# Patient Record
Sex: Female | Born: 1955 | Race: White | Hispanic: No | Marital: Married | Smoking: Current every day smoker
Health system: Southern US, Community
[De-identification: ages and names within clinical notes are randomized; demographics above are authoritative.]

## PROBLEM LIST (undated history)

## (undated) DIAGNOSIS — I10 Essential (primary) hypertension: Secondary | ICD-10-CM

## (undated) DIAGNOSIS — I219 Acute myocardial infarction, unspecified: Secondary | ICD-10-CM

## (undated) DIAGNOSIS — I509 Heart failure, unspecified: Secondary | ICD-10-CM

## (undated) DIAGNOSIS — K5790 Diverticulosis of intestine, part unspecified, without perforation or abscess without bleeding: Secondary | ICD-10-CM

## (undated) DIAGNOSIS — E7800 Pure hypercholesterolemia, unspecified: Secondary | ICD-10-CM

## (undated) HISTORY — PX: CHOLECYSTECTOMY: SHX55

## (undated) HISTORY — PX: OOPHORECTOMY: SHX6387

---

## 2009-09-28 ENCOUNTER — Emergency Department (HOSPITAL_BASED_OUTPATIENT_CLINIC_OR_DEPARTMENT_OTHER): Admission: EM | Admit: 2009-09-28 | Discharge: 2009-09-28 | Payer: Self-pay | Admitting: Emergency Medicine

## 2009-09-28 ENCOUNTER — Ambulatory Visit: Payer: Self-pay | Admitting: Diagnostic Radiology

## 2010-08-05 LAB — DIFFERENTIAL
Basophils Absolute: 0.2 10*3/uL — ABNORMAL HIGH (ref 0.0–0.1)
Basophils Relative: 2 % — ABNORMAL HIGH (ref 0–1)
Eosinophils Absolute: 0.1 10*3/uL (ref 0.0–0.7)
Eosinophils Relative: 1 % (ref 0–5)
Lymphocytes Relative: 15 % (ref 12–46)
Monocytes Absolute: 0.7 10*3/uL (ref 0.1–1.0)
Monocytes Relative: 6 % (ref 3–12)

## 2010-08-05 LAB — BASIC METABOLIC PANEL
BUN: 10 mg/dL (ref 6–23)
CO2: 27 mEq/L (ref 19–32)
Calcium: 9 mg/dL (ref 8.4–10.5)
Chloride: 108 mEq/L (ref 96–112)
Creatinine, Ser: 0.8 mg/dL (ref 0.4–1.2)
GFR calc non Af Amer: >60 mL/min (ref >60)
Glucose, Bld: 108 mg/dL — ABNORMAL HIGH (ref 70–99)

## 2010-08-05 LAB — URINALYSIS, ROUTINE W REFLEX MICROSCOPIC
Hgb urine dipstick: NEGATIVE
Protein, ur: NEGATIVE mg/dL
Specific Gravity, Urine: 1.013 (ref 1.005–1.030)
Urobilinogen, UA: 0.2 mg/dL (ref 0.0–1.0)

## 2010-08-05 LAB — HEMOCCULT GUIAC POC 1CARD (OFFICE): Fecal Occult Bld: NEGATIVE

## 2010-08-05 LAB — CBC
Hemoglobin: 13.3 g/dL (ref 12.0–15.0)
WBC: 12.5 10*3/uL — ABNORMAL HIGH (ref 4.0–10.5)

## 2016-08-14 ENCOUNTER — Emergency Department (HOSPITAL_COMMUNITY): Payer: Self-pay

## 2016-08-14 ENCOUNTER — Emergency Department (HOSPITAL_COMMUNITY)
Admission: EM | Admit: 2016-08-14 | Discharge: 2016-08-14 | Disposition: A | Discharge status: Home / Self Care | Payer: Self-pay | Attending: Emergency Medicine | Admitting: Emergency Medicine

## 2016-08-14 ENCOUNTER — Encounter: Payer: Self-pay | Admitting: Emergency Medicine

## 2016-08-14 DIAGNOSIS — K5792 Diverticulitis of intestine, part unspecified, without perforation or abscess without bleeding: Secondary | ICD-10-CM | POA: Insufficient documentation

## 2016-08-14 DIAGNOSIS — I252 Old myocardial infarction: Secondary | ICD-10-CM | POA: Insufficient documentation

## 2016-08-14 DIAGNOSIS — F17210 Nicotine dependence, cigarettes, uncomplicated: Secondary | ICD-10-CM | POA: Insufficient documentation

## 2016-08-14 DIAGNOSIS — Z7982 Long term (current) use of aspirin: Secondary | ICD-10-CM | POA: Insufficient documentation

## 2016-08-14 DIAGNOSIS — I10 Essential (primary) hypertension: Secondary | ICD-10-CM | POA: Insufficient documentation

## 2016-08-14 HISTORY — DX: Pure hypercholesterolemia, unspecified: E78.00

## 2016-08-14 HISTORY — DX: Heart failure, unspecified: I50.9

## 2016-08-14 HISTORY — DX: Acute myocardial infarction, unspecified: I21.9

## 2016-08-14 HISTORY — DX: Essential (primary) hypertension: I10

## 2016-08-14 HISTORY — DX: Diverticulosis of intestine, part unspecified, without perforation or abscess without bleeding: K57.90

## 2016-08-14 LAB — COMPREHENSIVE METABOLIC PANEL
ALBUMIN: 3.9 g/dL (ref 3.5–5.0)
ALT: 22 U/L (ref 14–54)
ANION GAP: 10 (ref 5–15)
AST: 21 U/L (ref 15–41)
Alkaline Phosphatase: 60 U/L (ref 38–126)
BUN: 14 mg/dL (ref 6–20)
CALCIUM: 9.4 mg/dL (ref 8.9–10.3)
CHLORIDE: 106 mmol/L (ref 101–111)
CO2: 24 mmol/L (ref 22–32)
Creatinine, Ser: 0.99 mg/dL (ref 0.44–1.00)
GFR calc non Af Amer: >60 mL/min (ref >60)
GLUCOSE: 76 mg/dL (ref 65–99)
POTASSIUM: 4.2 mmol/L (ref 3.5–5.1)
SODIUM: 140 mmol/L (ref 135–145)
Total Bilirubin: 0.6 mg/dL (ref 0.3–1.2)
Total Protein: 7.1 g/dL (ref 6.5–8.1)

## 2016-08-14 LAB — LIPASE, BLOOD: LIPASE: 23 U/L (ref 11–51)

## 2016-08-14 LAB — CBC
HEMATOCRIT: 42.5 % (ref 36.0–46.0)
HEMOGLOBIN: 14.3 g/dL (ref 12.0–15.0)
MCH: 29.9 pg (ref 26.0–34.0)
MCHC: 33.6 g/dL (ref 30.0–36.0)
MCV: 88.7 fL (ref 78.0–100.0)
Platelets: 342 10*3/uL (ref 150–400)
RBC: 4.79 MIL/uL (ref 3.87–5.11)
RDW: 14.5 % (ref 11.5–15.5)
WBC: 8.7 10*3/uL (ref 4.0–10.5)

## 2016-08-14 LAB — URINALYSIS, ROUTINE W REFLEX MICROSCOPIC
Bilirubin Urine: NEGATIVE
Glucose, UA: NEGATIVE mg/dL
HGB URINE DIPSTICK: NEGATIVE
Ketones, ur: NEGATIVE mg/dL
Leukocytes, UA: NEGATIVE
Nitrite: NEGATIVE
PH: 5 (ref 5.0–8.0)
Protein, ur: NEGATIVE mg/dL
SPECIFIC GRAVITY, URINE: 1.008 (ref 1.005–1.030)

## 2016-08-14 LAB — PREGNANCY, URINE: PREG TEST UR: NEGATIVE

## 2016-08-14 MED ORDER — MORPHINE SULFATE (PF) 4 MG/ML IV SOLN
4.0000 mg | Freq: Once | INTRAVENOUS | Status: AC
  Administered 2016-08-14: 4 mg via INTRAVENOUS
  Filled 2016-08-14: qty 1

## 2016-08-14 MED ORDER — METRONIDAZOLE 500 MG PO TABS: 500.0000 mg | ORAL_TABLET | Freq: Three times a day (TID) | ORAL | 0 refills | Status: AC

## 2016-08-14 MED ORDER — SODIUM CHLORIDE 0.9 % IV BOLUS (SEPSIS)
1000.0000 mL | Freq: Once | INTRAVENOUS | Status: AC
  Administered 2016-08-14: 1000 mL via INTRAVENOUS

## 2016-08-14 MED ORDER — IOPAMIDOL (ISOVUE-300) INJECTION 61%
100.0000 mL | Freq: Once | INTRAVENOUS | Status: AC | PRN
  Administered 2016-08-14: 100 mL via INTRAVENOUS

## 2016-08-14 MED ORDER — HYDROCODONE-ACETAMINOPHEN 5-325 MG PO TABS: 1.0000 | ORAL_TABLET | Freq: Four times a day (QID) | ORAL | 0 refills | Status: AC | PRN

## 2016-08-14 MED ORDER — ONDANSETRON HCL 4 MG/2ML IJ SOLN
4.0000 mg | Freq: Once | INTRAMUSCULAR | Status: AC
  Administered 2016-08-14: 4 mg via INTRAVENOUS
  Filled 2016-08-14: qty 2

## 2016-08-14 MED ORDER — CIPROFLOXACIN HCL 500 MG PO TABS: 500.0000 mg | ORAL_TABLET | Freq: Two times a day (BID) | ORAL | 0 refills | Status: AC

## 2016-08-14 NOTE — ED Provider Notes (Signed)
[facility]-EMERGENCY DEPT Provider Note   CSN: [id number] Arrival date & time: 08/14/16  1606     History   Chief Complaint Chief Complaint  Patient presents with  . Abdominal Pain    HPI Roslin Norwood is a 61 y.o. female.  Patient is a 61 year old female with past medical history of coronary artery disease with stents, prior diverticulitis, and hypertension. She presents for evaluation of lower abdominal pain. This is been present and worsening for the past 2 weeks. This feels somewhat different from her prior episodes of diverticulitis. She does report a small amount of blood when she wipes after having a bowel movement, but denies melena. She denies any urinary complaints. She denies any vomiting or fever. Prior surgical history includes cholecystectomy and right oophorectomy.   The history is provided by the patient.  Abdominal Pain   This is a new problem. Episode onset: 2 weeks ago. The problem occurs constantly. The problem has been gradually worsening. The pain is located in the RLQ. The pain is moderate. Pertinent negatives include fever, melena, constipation and dysuria. The symptoms are aggravated by certain positions. Nothing relieves the symptoms.    Past Medical History:  Diagnosis Date  . Acute MI   . Diverticulosis   . Hypercholesteremia   . Hypertension     There are no active problems to display for this patient.   Past Surgical History:  Procedure Laterality Date  . CHOLECYSTECTOMY    . OOPHORECTOMY Right     OB History    No data available       Home Medications    Prior to Admission medications   Medication Sig Start Date End Date Taking? Authorizing Provider  aspirin EC 81 MG tablet Take 81 mg by mouth daily.   Yes Historical Provider, MD  Calcium Carb-Cholecalciferol (CALCIUM-VITAMIN D) 500-200 MG-UNIT tablet Take 1 tablet by mouth daily.   Yes Historical Provider, MD  cholecalciferol (VITAMIN D) 1000 units tablet Take 2,000 Units by mouth daily.    Yes Historical Provider, MD  citalopram (CELEXA) 40 MG tablet Take 40 mg by mouth daily.   Yes Historical Provider, MD  clopidogrel (PLAVIX) 75 MG tablet Take 75 mg by mouth daily.   Yes Historical Provider, MD  lansoprazole (PREVACID) 30 MG capsule Take 30 mg by mouth daily at 12 noon.   Yes Historical Provider, MD  losartan (COZAAR) 25 MG tablet Take 25 mg by mouth daily.   Yes Historical Provider, MD  magnesium oxide (MAG-OX) 400 MG tablet Take 400 mg by mouth daily.   Yes Historical Provider, MD  metoprolol succinate (TOPROL-XL) 50 MG 24 hr tablet Take 50 mg by mouth daily. Take with or immediately following a meal.   Yes Historical Provider, MD  rosuvastatin (CRESTOR) 40 MG tablet Take 40 mg by mouth daily.   Yes Historical Provider, MD    Family History No family history on file.  Social History Social History  Substance Use Topics  . Smoking status: Current Every Day Smoker    Packs/day: 0.20    Types: Cigarettes  . Smokeless tobacco: Never Used  . Alcohol use No     Allergies   Reglan [metoclopramide]   Review of Systems Review of Systems  Constitutional: Negative for fever.  Gastrointestinal: Positive for abdominal pain. Negative for constipation and melena.  Genitourinary: Negative for dysuria.  All other systems reviewed and are negative.    Physical Exam Updated Vital Signs BP (!) 187/98 (BP Location: Left Arm)  Pulse 66   Temp 98.5 F (36.9 C) (Oral)   Resp 18   Ht  (1.549 m)   Wt 190 lb (86.2 kg)   SpO2 100%   BMI 35.90 kg/m   Physical Exam  Constitutional: She is oriented to person, place, and time. She appears well-developed and well-nourished. No distress.  HENT:  Head: Normocephalic and atraumatic.  Neck: Normal range of motion. Neck supple.  Cardiovascular: Normal rate and regular rhythm.  Exam reveals no gallop and no friction rub.   No murmur heard. Pulmonary/Chest: Effort normal and breath sounds normal. No respiratory distress.  She has no wheezes.  Abdominal: Soft. Bowel sounds are normal. She exhibits no distension. There is tenderness. There is no rebound and no guarding.  There is tenderness to palpation in the right lower quadrant, suprapubic region, and left lower quadrant. There is no rebound or guarding.  Musculoskeletal: Normal range of motion.  Neurological: She is alert and oriented to person, place, and time.  Skin: Skin is warm and dry. She is not diaphoretic.  Nursing note and vitals reviewed.    ED Treatments / Results  Labs (all labs ordered are listed, but only abnormal results are displayed) Labs Reviewed  LIPASE, BLOOD  COMPREHENSIVE METABOLIC PANEL  CBC  URINALYSIS, ROUTINE W REFLEX MICROSCOPIC    EKG  EKG Interpretation None       Radiology No results found.  Procedures Procedures (including critical care time)  Medications Ordered in ED Medications  sodium chloride 0.9 % bolus 1,000 mL (not administered)  ondansetron (ZOFRAN) injection 4 mg (not administered)  morphine 4 MG/ML injection 4 mg (not administered)     Initial Impression / Assessment and Plan / ED Course  I have reviewed the triage vital signs and the nursing notes.  Pertinent labs & imaging results that were available during my care of the patient were reviewed by me and considered in my medical decision making (see chart for details).  CT scan shows probable diverticulitis. Will discharge with cipro, flagyl, pain meds, and prn return/follow up.  Final Clinical Impressions(s) / ED Diagnoses   Final diagnoses:  None    New Prescriptions New Prescriptions   No medications on file     Geoffery Lyons, MD 08/14/16 2133

## 2016-08-14 NOTE — Discharge Instructions (Signed)
Frontal and Flagyl as prescribed.  Hydrocodone as prescribed as needed for pain.  Also with your primary Dr. if not improving in the next 3-4 days, and return to the emergency department if you develop worsening pain, high fever, bloody stool, or other new and concerning symptoms.

## 2016-08-14 NOTE — ED Triage Notes (Addendum)
Pt c/o bil lower abd cramping onset x 2 wks & small amount of rectal bleeding from hemrhoids once in 2 wks with wiping, pt hx of Diverticulosis, pt denies n/v/d, A&O x4

## 2016-08-14 NOTE — ED Notes (Signed)
Patient returned from CT. Monitoring resumed. 

## 2016-08-14 NOTE — ED Notes (Signed)
Patient transported to CT 

## 2017-08-03 IMAGING — CT CT ABD-PELV W/ CM
2 of 5 series · 15 of 46 positions shown, 17 images · IV contrast (iopamidol)
Comparison: CT of the abdomen and pelvis from 09/28/2009

CLINICAL DATA: Acute onset of generalized abdominal pain, nausea
and cramping. Initial encounter.

EXAM:
CT ABDOMEN AND PELVIS WITH CONTRAST
TECHNIQUE: Multidetector CT imaging of the abdomen and pelvis was performed
using the standard protocol following bolus administration of
intravenous contrast.
CONTRAST:  100mL TJF36J-HKK IOPAMIDOL (TJF36J-HKK) INJECTION 61%

[Series 3: abd/ pelvis 5.0 i30f 2 · axial · 0.98mm/px · z∈[-397,-17]mm · 12 of 86 slices shown, 14 images]
[im 5/86  soft-tissue]
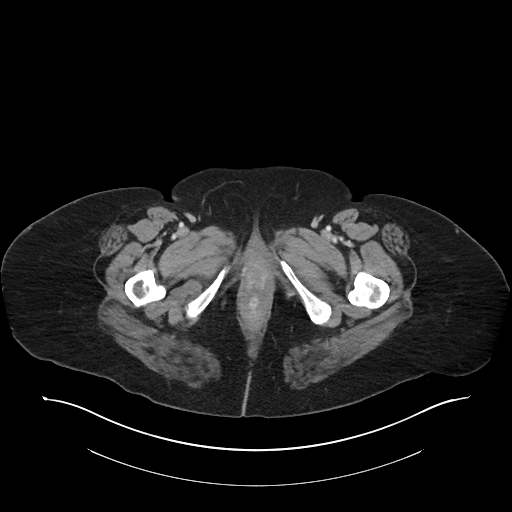
[im 5/86  bone]
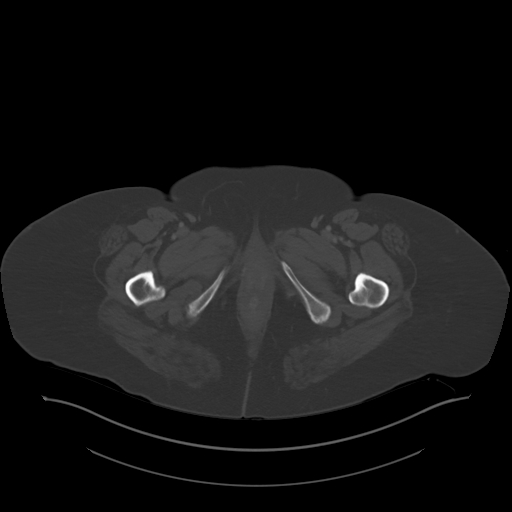
[im 14/86  soft-tissue]
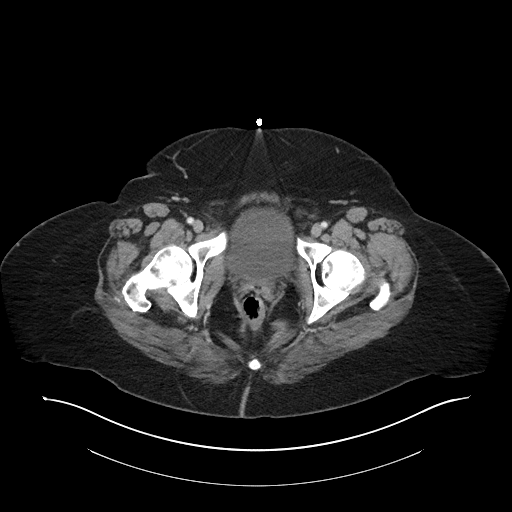
[im 18/86  soft-tissue]
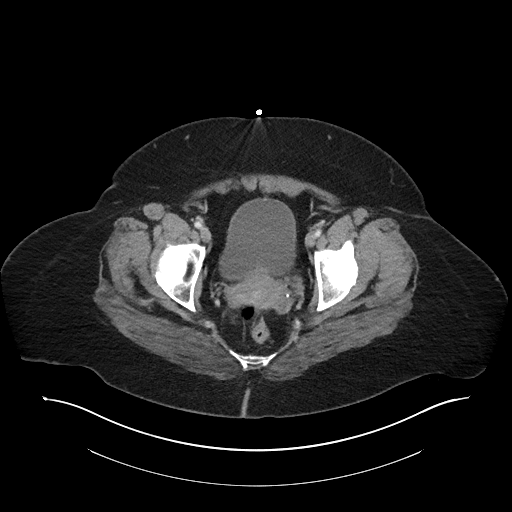
[im 27/86  soft-tissue]
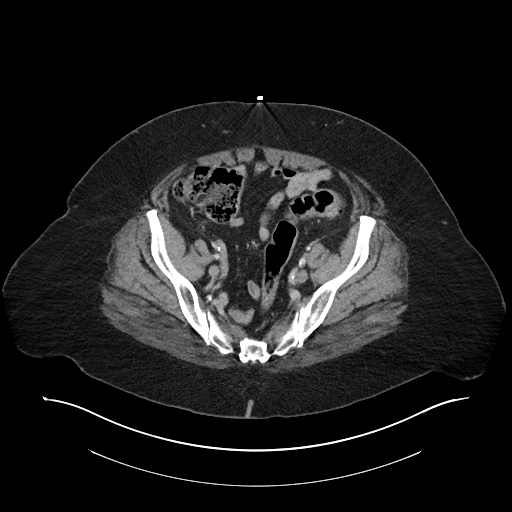
[im 32/86  soft-tissue]
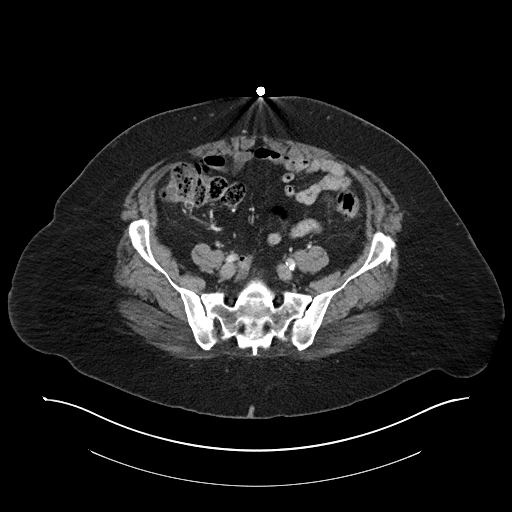
[im 41/86  soft-tissue]
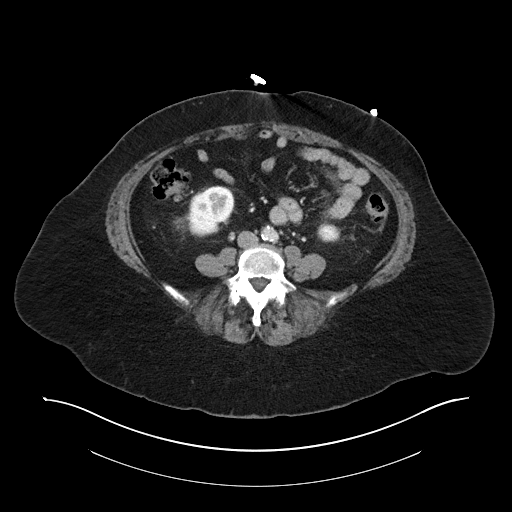
[im 45/86  soft-tissue]
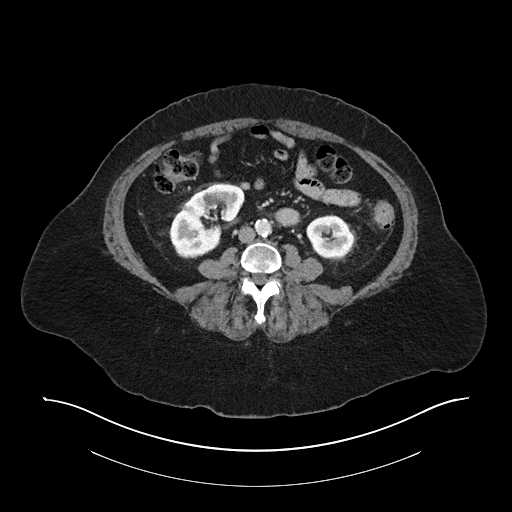
[im 54/86  soft-tissue]
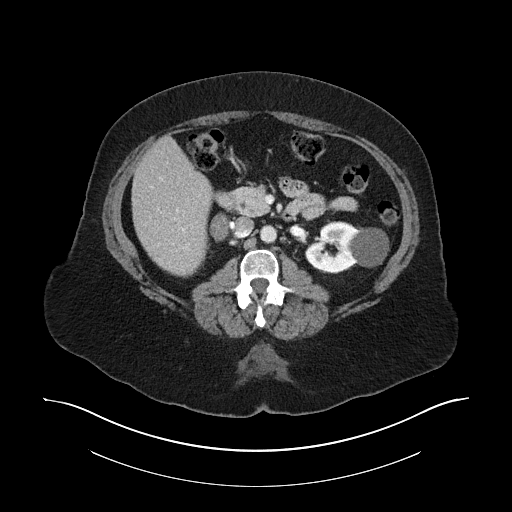
[im 59/86  soft-tissue]
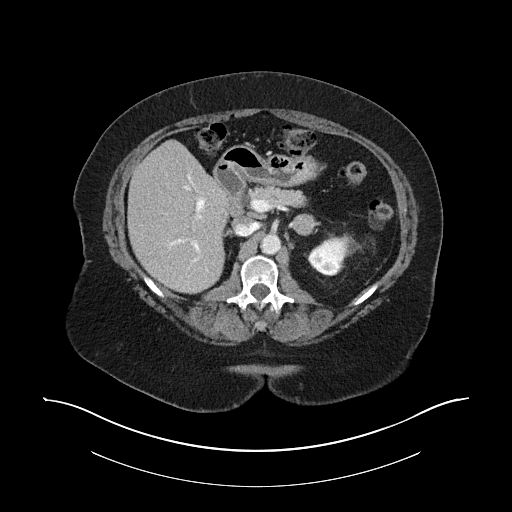
[im 59/86  bone]
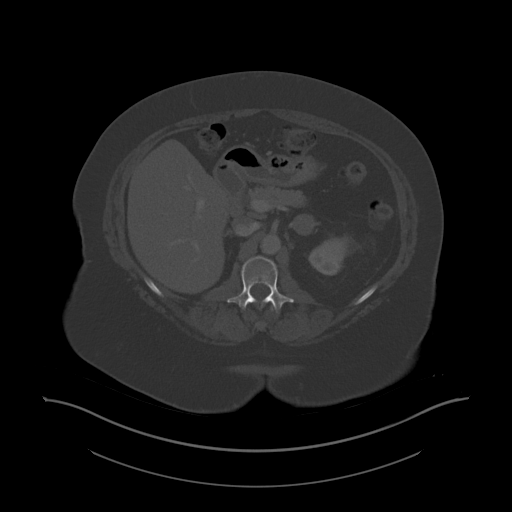
[im 68/86  soft-tissue]
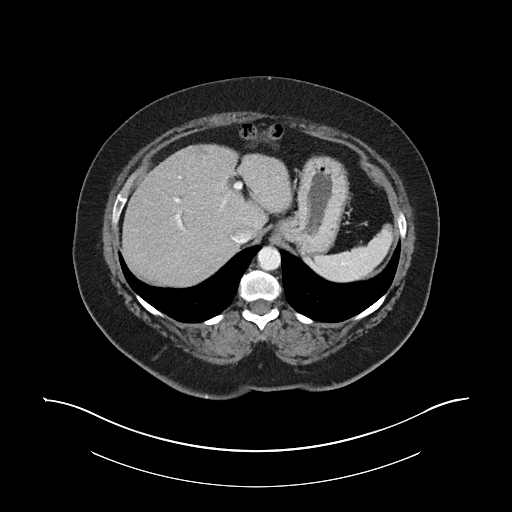
[im 72/86  soft-tissue]
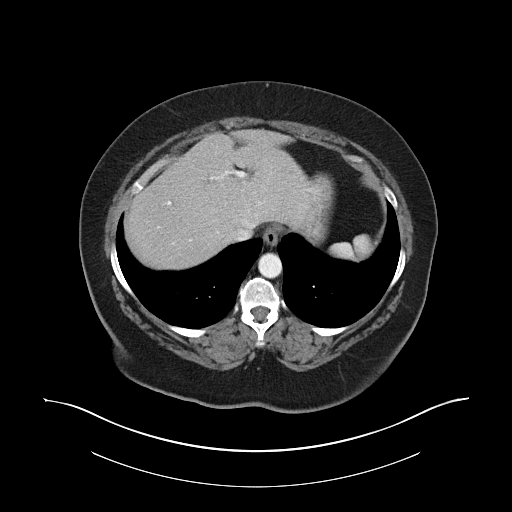
[im 81/86  soft-tissue]
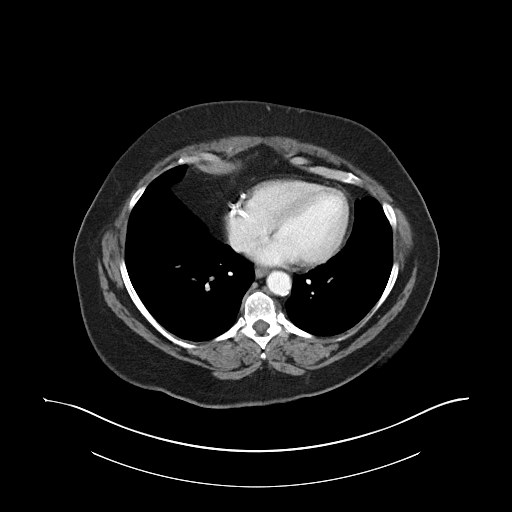

[Series 6: coronal soft tissue · coronal · 0.83mm/px · 3 of 121 slices shown]
[im 41/121  soft-tissue]
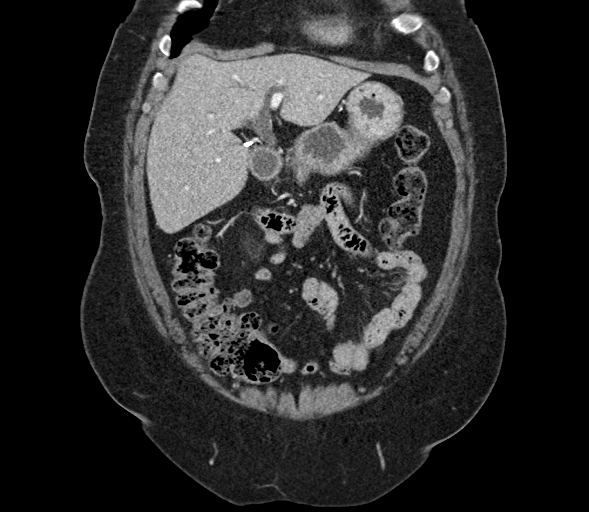
[im 54/121  soft-tissue]
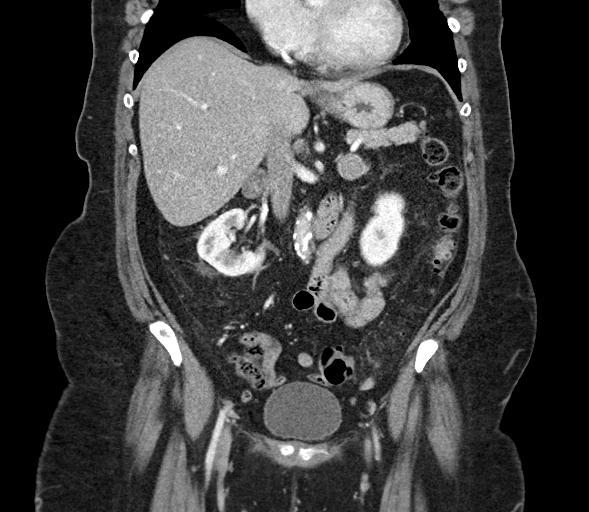
[im 67/121  soft-tissue]
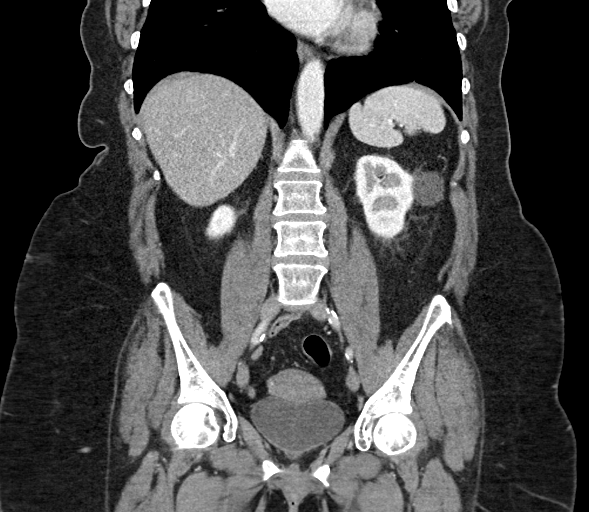

[15 of 46 positions shown; findings below may reference images not displayed]

FINDINGS: Lower chest: Diffuse coronary artery calcifications are seen. The
visualized lung bases are grossly clear.

Hepatobiliary: The liver is unremarkable in appearance. The patient
is status post cholecystectomy, with clips noted at the gallbladder
fossa. The common bile duct remains normal in caliber.

Pancreas: The pancreas is within normal limits.

Spleen: The spleen is unremarkable in appearance.

Adrenals/Urinary Tract: A 3.0 cm right adrenal nodule is only
minimally increased in size from 4344 and likely reflects an adrenal
adenoma. The left adrenal gland is unremarkable in appearance.

Scattered bilateral renal cysts are seen. Nonspecific perinephric
stranding is noted bilaterally. There is no evidence of
hydronephrosis. No renal or ureteral stones are identified.

Stomach/Bowel: The stomach is unremarkable in appearance. The small
bowel is within normal limits. The appendix is normal in caliber,
without evidence of appendicitis.

Scattered diverticulosis is noted along the descending and proximal
sigmoid colon. There is trace adjacent fluid along the distal
descending colon, with mild soft tissue inflammation, raising
question for very mild diverticulitis.

Vascular/Lymphatic: Scattered calcification is seen along the
abdominal aorta and its branches. The abdominal aorta is otherwise
grossly unremarkable. The inferior vena cava is grossly
unremarkable. No retroperitoneal lymphadenopathy is seen. No pelvic
sidewall lymphadenopathy is identified.

Reproductive: The bladder is mildly distended and within normal
limits. The uterus is grossly unremarkable in appearance. The
ovaries are relatively symmetric. No suspicious adnexal masses are
seen.

Other: No additional soft tissue abnormalities are seen.

Musculoskeletal: No acute osseous abnormalities are identified. The
visualized musculature is unremarkable in appearance.
IMPRESSION: 1. Trace fluid about the distal descending colon, with mild soft
tissue inflammation, raising question for very mild acute
diverticulitis.
2. Scattered diverticulosis along the descending and proximal
sigmoid colon.
3. Diffuse coronary artery calcifications seen.
4. 3.0 cm right adrenal nodule is only minimally increased in size
from 4344 and likely reflects a benign adrenal adenoma.
5. Scattered bilateral renal cysts seen.
6. Scattered aortic atherosclerosis.
